# Patient Record
Sex: Female | Born: 2005 | Hispanic: No | Marital: Single | Smoking: Never smoker
Health system: Southern US, Community
[De-identification: ages and names within clinical notes are randomized; demographics above are authoritative.]

## PROBLEM LIST (undated history)

## (undated) DIAGNOSIS — M1990 Unspecified osteoarthritis, unspecified site: Secondary | ICD-10-CM

---
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
## 2024-07-26 ENCOUNTER — Encounter: Payer: Self-pay | Admitting: Certified Nurse Midwife

## 2024-08-02 ENCOUNTER — Encounter: Payer: Self-pay | Admitting: Obstetrics and Gynecology

## 2024-09-20 ENCOUNTER — Encounter: Payer: Self-pay | Admitting: Certified Nurse Midwife

## 2024-09-20 DIAGNOSIS — Z3009 Encounter for other general counseling and advice on contraception: Secondary | ICD-10-CM

## 2024-09-20 DIAGNOSIS — Z01419 Encounter for gynecological examination (general) (routine) without abnormal findings: Secondary | ICD-10-CM
